# Patient Record
Sex: Male | Born: 1985 | Race: White | Hispanic: No | Marital: Single | Smoking: Current every day smoker
Health system: Southern US, Community
[De-identification: ages and names within clinical notes are randomized; demographics above are authoritative.]

---

## 2009-07-02 ENCOUNTER — Emergency Department: Payer: Self-pay | Admitting: Emergency Medicine

## 2010-12-16 ENCOUNTER — Emergency Department: Payer: Self-pay

## 2011-08-31 ENCOUNTER — Emergency Department: Payer: Self-pay

## 2012-03-04 ENCOUNTER — Emergency Department: Payer: Self-pay | Admitting: Emergency Medicine

## 2012-07-08 ENCOUNTER — Emergency Department: Payer: Self-pay | Admitting: Emergency Medicine

## 2012-11-19 ENCOUNTER — Emergency Department: Payer: Self-pay | Admitting: Emergency Medicine

## 2013-12-08 ENCOUNTER — Emergency Department: Payer: Self-pay | Admitting: Emergency Medicine

## 2013-12-08 LAB — COMPREHENSIVE METABOLIC PANEL
ALT: 37 U/L (ref 12–78)
AST: 32 U/L (ref 15–37)
Albumin: 4.1 g/dL (ref 3.4–5.0)
Alkaline Phosphatase: 63 U/L
Anion Gap: 3 — ABNORMAL LOW (ref 7–16)
BUN: 11 mg/dL (ref 7–18)
Bilirubin,Total: 0.6 mg/dL (ref 0.2–1.0)
CALCIUM: 9.1 mg/dL (ref 8.5–10.1)
CHLORIDE: 106 mmol/L (ref 98–107)
Co2: 29 mmol/L (ref 21–32)
Creatinine: 1.04 mg/dL (ref 0.60–1.30)
EGFR (Non-African Amer.): >60
Glucose: 95 mg/dL (ref 65–99)
OSMOLALITY: 275 (ref 275–301)
Potassium: 4 mmol/L (ref 3.5–5.1)
SODIUM: 138 mmol/L (ref 136–145)
TOTAL PROTEIN: 8.3 g/dL — AB (ref 6.4–8.2)

## 2013-12-08 LAB — CBC
HCT: 43.2 % (ref 40.0–52.0)
HGB: 15.2 g/dL (ref 13.0–18.0)
MCH: 31.9 pg (ref 26.0–34.0)
MCHC: 35.1 g/dL (ref 32.0–36.0)
MCV: 91 fL (ref 80–100)
PLATELETS: 355 10*3/uL (ref 150–440)
RBC: 4.77 10*6/uL (ref 4.40–5.90)
RDW: 12.9 % (ref 11.5–14.5)
WBC: 13.8 10*3/uL — ABNORMAL HIGH (ref 3.8–10.6)

## 2013-12-08 LAB — URINALYSIS, COMPLETE
BACTERIA: NONE SEEN
BLOOD: NEGATIVE
Bilirubin,UR: NEGATIVE
Glucose,UR: NEGATIVE mg/dL (ref 0–75)
KETONE: NEGATIVE
Leukocyte Esterase: NEGATIVE
Nitrite: NEGATIVE
PH: 7 (ref 4.5–8.0)
Protein: NEGATIVE
Specific Gravity: 1.021 (ref 1.003–1.030)
Squamous Epithelial: NONE SEEN

## 2013-12-08 LAB — DRUG SCREEN, URINE
Amphetamines, Ur Screen: NEGATIVE (ref ?–1000)
Barbiturates, Ur Screen: NEGATIVE (ref ?–200)
Benzodiazepine, Ur Scrn: NEGATIVE (ref ?–200)
Cannabinoid 50 Ng, Ur ~~LOC~~: POSITIVE (ref ?–50)
Cocaine Metabolite,Ur ~~LOC~~: POSITIVE (ref ?–300)
MDMA (Ecstasy)Ur Screen: NEGATIVE (ref ?–500)
METHADONE, UR SCREEN: NEGATIVE (ref ?–300)
Opiate, Ur Screen: NEGATIVE (ref ?–300)
Phencyclidine (PCP) Ur S: NEGATIVE (ref ?–25)
Tricyclic, Ur Screen: NEGATIVE (ref ?–1000)

## 2013-12-08 LAB — SALICYLATE LEVEL: Salicylates, Serum: <1.7 mg/dL

## 2013-12-08 LAB — ACETAMINOPHEN LEVEL

## 2013-12-08 LAB — ETHANOL
Ethanol %: <0.003 % (ref 0.000–0.080)
Ethanol: <3 mg/dL

## 2013-12-16 ENCOUNTER — Emergency Department (HOSPITAL_COMMUNITY): Payer: Self-pay

## 2013-12-16 ENCOUNTER — Emergency Department (HOSPITAL_COMMUNITY)
Admission: EM | Admit: 2013-12-16 | Discharge: 2013-12-16 | Discharge status: Against Medical Advice | Payer: Self-pay | Attending: Emergency Medicine | Admitting: Emergency Medicine

## 2013-12-16 ENCOUNTER — Encounter (HOSPITAL_COMMUNITY): Payer: Self-pay | Admitting: Emergency Medicine

## 2013-12-16 DIAGNOSIS — R3911 Hesitancy of micturition: Secondary | ICD-10-CM | POA: Insufficient documentation

## 2013-12-16 DIAGNOSIS — R300 Dysuria: Secondary | ICD-10-CM | POA: Insufficient documentation

## 2013-12-16 DIAGNOSIS — R3915 Urgency of urination: Secondary | ICD-10-CM | POA: Insufficient documentation

## 2013-12-16 DIAGNOSIS — R350 Frequency of micturition: Secondary | ICD-10-CM | POA: Insufficient documentation

## 2013-12-16 DIAGNOSIS — F17200 Nicotine dependence, unspecified, uncomplicated: Secondary | ICD-10-CM | POA: Insufficient documentation

## 2013-12-16 DIAGNOSIS — R109 Unspecified abdominal pain: Secondary | ICD-10-CM | POA: Insufficient documentation

## 2013-12-16 LAB — CBC WITH DIFFERENTIAL/PLATELET
BASOS ABS: 0 10*3/uL (ref 0.0–0.1)
BASOS PCT: 0 % (ref 0–1)
EOS PCT: 1 % (ref 0–5)
Eosinophils Absolute: 0.1 10*3/uL (ref 0.0–0.7)
HEMATOCRIT: 39.8 % (ref 39.0–52.0)
Hemoglobin: 13.8 g/dL (ref 13.0–17.0)
Lymphocytes Relative: 24 % (ref 12–46)
Lymphs Abs: 2.6 10*3/uL (ref 0.7–4.0)
MCH: 32.2 pg (ref 26.0–34.0)
MCHC: 34.7 g/dL (ref 30.0–36.0)
MCV: 93 fL (ref 78.0–100.0)
Monocytes Absolute: 0.9 10*3/uL (ref 0.1–1.0)
Monocytes Relative: 8 % (ref 3–12)
Neutro Abs: 7.3 10*3/uL (ref 1.7–7.7)
Neutrophils Relative %: 67 % (ref 43–77)
Platelets: 319 10*3/uL (ref 150–400)
RBC: 4.28 MIL/uL (ref 4.22–5.81)
RDW: 13.1 % (ref 11.5–15.5)
WBC: 10.9 10*3/uL — ABNORMAL HIGH (ref 4.0–10.5)

## 2013-12-16 LAB — POCT I-STAT, CHEM 8
BUN: 17 mg/dL (ref 6–23)
CALCIUM ION: 1.25 mmol/L — AB (ref 1.12–1.23)
Chloride: 105 mEq/L (ref 96–112)
Creatinine, Ser: 1.1 mg/dL (ref 0.50–1.35)
Glucose, Bld: 105 mg/dL — ABNORMAL HIGH (ref 70–99)
HEMATOCRIT: 43 % (ref 39.0–52.0)
HEMOGLOBIN: 14.6 g/dL (ref 13.0–17.0)
Potassium: 4.1 mEq/L (ref 3.7–5.3)
Sodium: 141 mEq/L (ref 137–147)
TCO2: 25 mmol/L (ref 0–100)

## 2013-12-16 LAB — URINALYSIS, ROUTINE W REFLEX MICROSCOPIC
Bilirubin Urine: NEGATIVE
GLUCOSE, UA: NEGATIVE mg/dL
HGB URINE DIPSTICK: NEGATIVE
KETONES UR: NEGATIVE mg/dL
LEUKOCYTES UA: NEGATIVE
Nitrite: NEGATIVE
Protein, ur: NEGATIVE mg/dL
Specific Gravity, Urine: 1.027 (ref 1.005–1.030)
Urobilinogen, UA: 0.2 mg/dL (ref 0.0–1.0)
pH: 7.5 (ref 5.0–8.0)

## 2013-12-16 LAB — URINE MICROSCOPIC-ADD ON

## 2013-12-16 MED ORDER — IOHEXOL 300 MG/ML  SOLN
80.0000 mL | Freq: Once | INTRAMUSCULAR | Status: AC | PRN
  Administered 2013-12-16: 90 mL via INTRAVENOUS

## 2013-12-16 MED ORDER — MORPHINE SULFATE 4 MG/ML IJ SOLN
4.0000 mg | Freq: Once | INTRAMUSCULAR | Status: AC
  Administered 2013-12-16: 4 mg via INTRAVENOUS
  Filled 2013-12-16: qty 1

## 2013-12-16 MED ORDER — SODIUM CHLORIDE 0.9 % IV BOLUS (SEPSIS)
1000.0000 mL | Freq: Once | INTRAVENOUS | Status: AC
  Administered 2013-12-16: 1000 mL via INTRAVENOUS

## 2013-12-16 MED ORDER — IOHEXOL 300 MG/ML  SOLN
25.0000 mL | Freq: Once | INTRAMUSCULAR | Status: AC | PRN
  Administered 2013-12-16: 25 mL via ORAL

## 2013-12-16 MED ORDER — ONDANSETRON HCL 4 MG/2ML IJ SOLN
4.0000 mg | Freq: Once | INTRAMUSCULAR | Status: AC
  Administered 2013-12-16: 4 mg via INTRAVENOUS
  Filled 2013-12-16: qty 2

## 2013-12-16 MED ORDER — KETOROLAC TROMETHAMINE 30 MG/ML IJ SOLN
30.0000 mg | Freq: Once | INTRAMUSCULAR | Status: DC
  Filled 2013-12-16: qty 1

## 2013-12-16 NOTE — ED Notes (Signed)
Hurts to void since last night sharp pain denies   D/c from penis  C/o back pain left side

## 2013-12-16 NOTE — ED Provider Notes (Signed)
Medical screening examination/treatment/procedure(s) were performed by non-physician practitioner and as supervising physician I was immediately available for consultation/collaboration.  EKG Interpretation   None         Gwyneth SproutWhitney Ryder Chesmore, MD 12/16/13 1559

## 2013-12-16 NOTE — Discharge Instructions (Signed)

## 2013-12-16 NOTE — ED Provider Notes (Signed)
CSN: [id number]     Arrival date & time 12/16/13  1040 History   First MD Initiated Contact with Patient 12/16/13 1109     Chief Complaint  Patient presents with  . Dysuria   (Consider location/radiation/quality/duration/timing/severity/associated sxs/prior Treatment) HPI  Patient presents to the ER with complaints of L flank pain for one week and dysuria starting today. He says that he has been having back pain and mild cramping pain that felt as though he had pulled a muscle, then this morning he had urgency, dysuria and hesitancy. He did not notice any hematuria. He denies any penile discharge, as noted in nurse note. He denies any penile pain or testicular, pain, swelling or rashes. He has not had fever, nausea, vomiting or diarrhea. No history of kidney stones. Bowel movements have been regular.  History reviewed. No pertinent past medical history. History reviewed. No pertinent past surgical history. No family history on file. History  Substance Use Topics  . Smoking status: Current Every Day Smoker  . Smokeless tobacco: Not on file  . Alcohol Use: Yes    Review of Systems The patient denies anorexia, fever, weight loss,, vision loss, decreased hearing, hoarseness, chest pain, syncope, dyspnea on exertion, peripheral edema, balance deficits, hemoptysis,  melena, hematochezia, severe indigestion/heartburn, hematuria, incontinence, genital sores, muscle weakness, suspicious skin lesions, transient blindness, difficulty walking, depression, unusual weight change, abnormal bleeding, enlarged lymph nodes, angioedema, and breast masses.  Allergies  Review of patient's allergies indicates no known allergies.  Home Medications   Current Outpatient Rx  Name  Route  Sig  Dispense  Refill  . acetaminophen (TYLENOL) 500 MG tablet   Oral   Take 500-1,000 mg by mouth every 6 (six) hours as needed for fever or headache (pain).         Marland Kitchen. ibuprofen (ADVIL,MOTRIN) 200 MG tablet   Oral  Take 200-400 mg by mouth every 6 (six) hours as needed for fever or headache (pain).          BP 133/69  Pulse 84  Temp(Src) 97.6 F (36.4 C) (Oral)  Resp 18  SpO2 97% Physical Exam  Nursing note and vitals reviewed. Constitutional: He appears well-developed and well-nourished. No distress.  HENT:  Head: Normocephalic and atraumatic.  Eyes: Pupils are equal, round, and reactive to light.  Neck: Normal range of motion. Neck supple.  Cardiovascular: Normal rate and regular rhythm.   Pulmonary/Chest: Effort normal.  Abdominal: Soft. Bowel sounds are normal. There is tenderness in the suprapubic area, left upper quadrant and left lower quadrant. There is CVA tenderness. There is no rigidity, no rebound, no guarding, no tenderness at McBurney's point and negative Murphy's sign.    Abdominal exam limited by large omentum    Neurological: He is alert.  Skin: Skin is warm and dry.    ED Course  Procedures (including critical care time) Labs Review Labs Reviewed  URINALYSIS, ROUTINE W REFLEX MICROSCOPIC - Abnormal; Notable for the following:    APPearance TURBID (*)    All other components within normal limits  CBC WITH DIFFERENTIAL - Abnormal; Notable for the following:    WBC 10.9 (*)    All other components within normal limits  POCT I-STAT, CHEM 8 - Abnormal; Notable for the following:    Glucose, Bld 105 (*)    Calcium, Ion 1.25 (*)    All other components within normal limits  GC/CHLAMYDIA PROBE AMP  URINE MICROSCOPIC-ADD ON   Imaging Review Ct Abdomen Pelvis W Contrast  12/16/2013   CLINICAL DATA:  Left lower quadrant pain, left flank pain.  EXAM: CT ABDOMEN AND PELVIS WITH CONTRAST  TECHNIQUE: Multidetector CT imaging of the abdomen and pelvis was performed using the standard protocol following bolus administration of intravenous contrast.  CONTRAST:  90mL OMNIPAQUE IOHEXOL 300 MG/ML  SOLN  COMPARISON:  None.  FINDINGS: Lung bases are clear. No effusions. Heart is normal  size.  Liver, gallbladder, spleen, pancreas, adrenals and kidneys are unremarkable. No renal or ureteral stones. No hydronephrosis. Urinary bladder is unremarkable.  Appendix is visualized and is normal. Stomach, large and small bowel are unremarkable. No free fluid, free air or adenopathy. No evidence of diverticulosis or diverticulitis.  No acute bony abnormality.  IMPRESSION: No acute findings in the abdomen or pelvis.   Electronically Signed   By: Charlett Nose M.D.   On: 12/16/2013 13:45    EKG Interpretation   None       MDM   1. Dysuria    Patients Urinalysis and abdominal CT have come back without any significant findings. He received fluids and pain medication in the ED but is feeling somewhat better but is still holding bellies.  The patient says he has not had sex for 7 months and that it was protected but due to his urinary symptoms I feel that he needs to be tested for [facility]. He refuses to be swabbed. Because of this I am unable to complete by work-up. I have discussed with the patient that he will need to sign out against medical advise meaning that he may 100% responsible for the hospital bill and is at risk of deterioration, inability to bear children or possibly death. He had decided to signs out AMA.     Dorthula Matas, PA-C 12/16/13 1406

## 2014-12-03 IMAGING — CT CT ABD-PELV W/ CM
2 of 4 series · 16 of 46 positions shown, 18 images · IV contrast (APPLIED)
Comparison: None.

CLINICAL DATA: Left lower quadrant pain, left flank pain.

EXAM:
CT ABDOMEN AND PELVIS WITH CONTRAST
TECHNIQUE: Multidetector CT imaging of the abdomen and pelvis was performed
using the standard protocol following bolus administration of
intravenous contrast.
CONTRAST:  90mL OMNIPAQUE IOHEXOL 300 MG/ML  SOLN

[Series 2: abd/ pelvis 5.0 b30f · axial · 0.98mm/px · z∈[+862,+1342]mm · 13 of 106 slices shown, 15 images]
[im 5/106  soft-tissue]
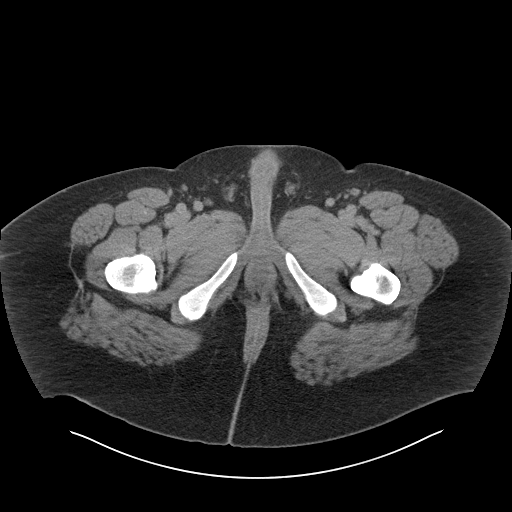
[im 5/106  bone]
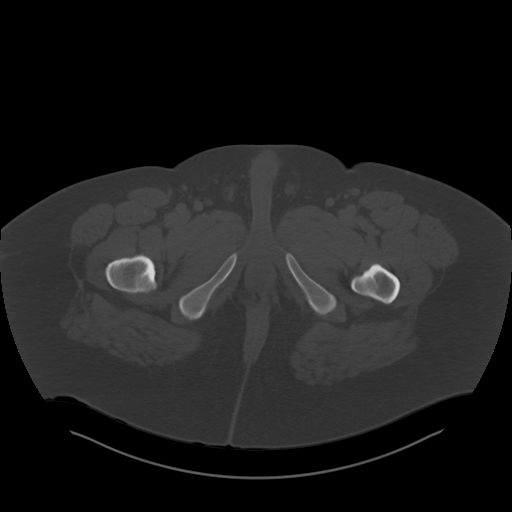
[im 14/106  soft-tissue]
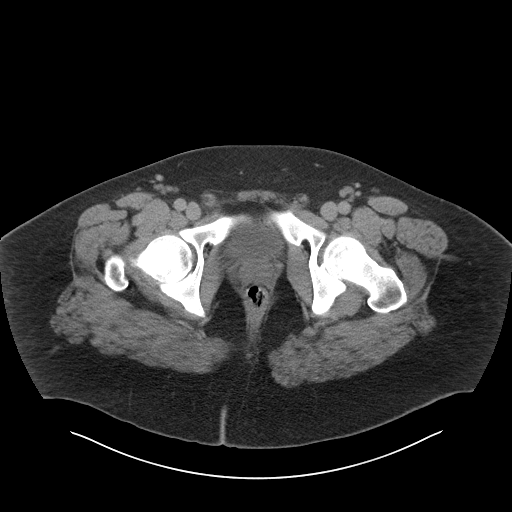
[im 23/106  soft-tissue]
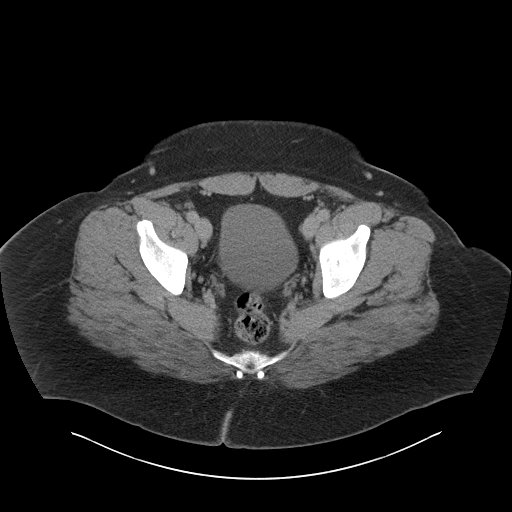
[im 28/106  soft-tissue]
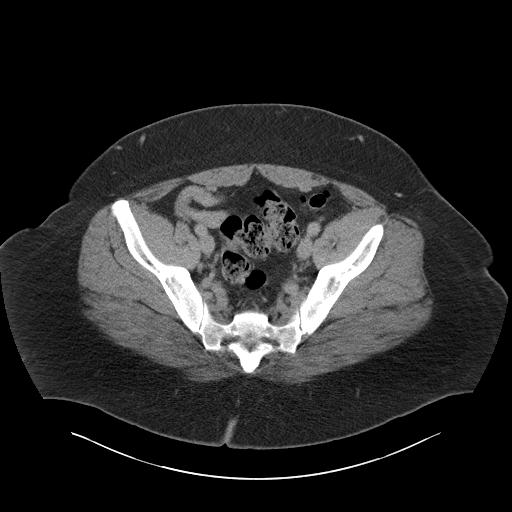
[im 37/106  soft-tissue]
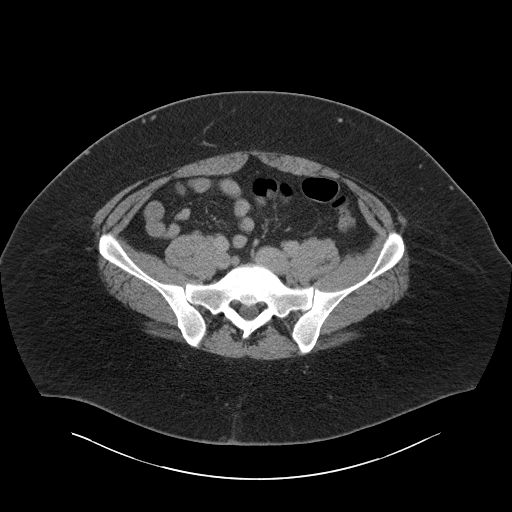
[im 46/106  soft-tissue]
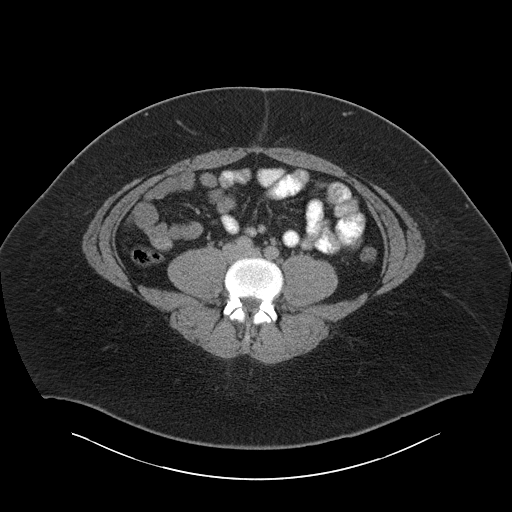
[im 55/106  soft-tissue]
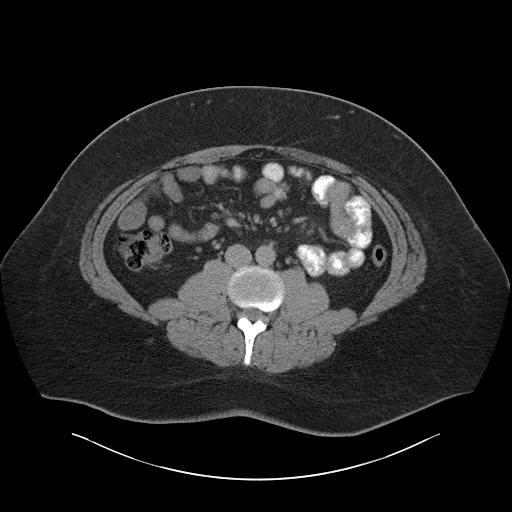
[im 60/106  soft-tissue]
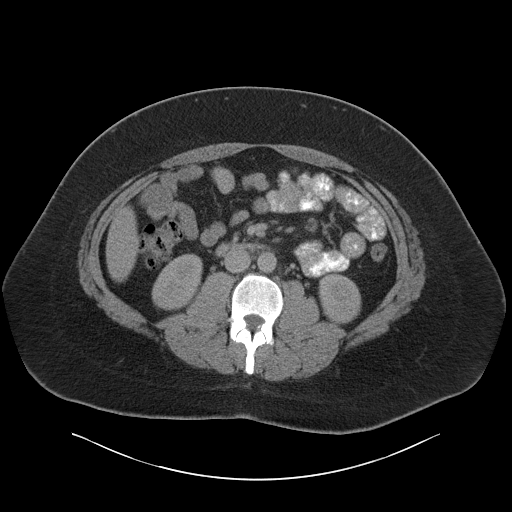
[im 69/106  soft-tissue]
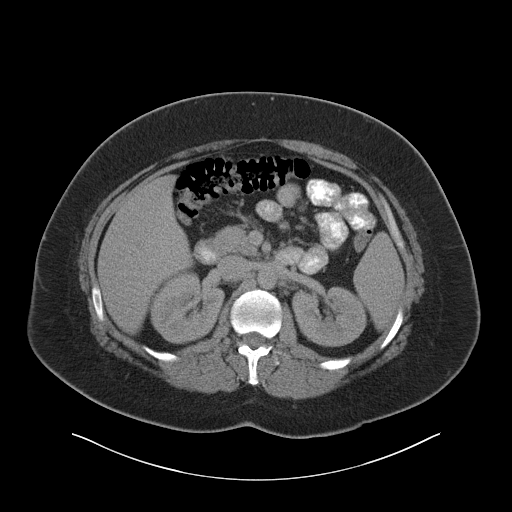
[im 69/106  bone]
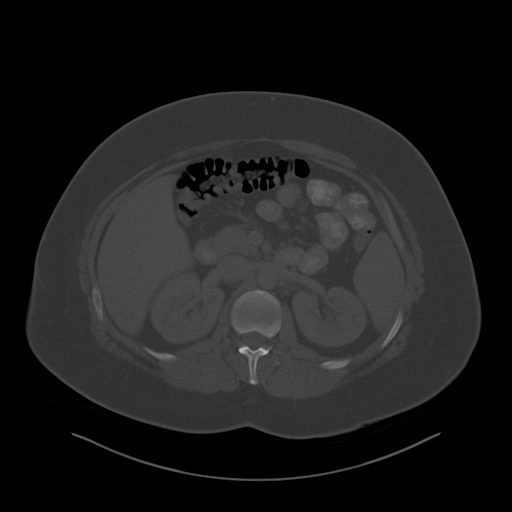
[im 78/106  soft-tissue]
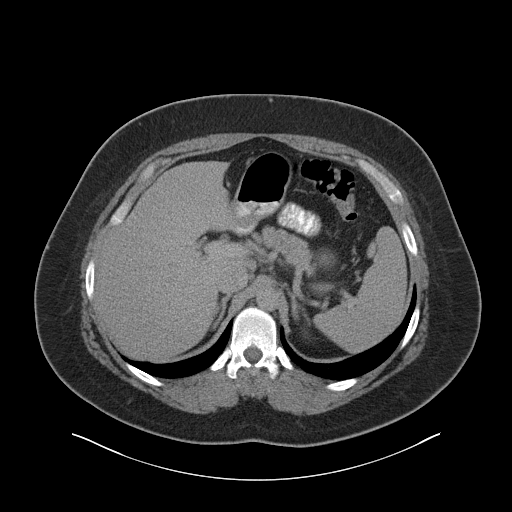
[im 83/106  soft-tissue]
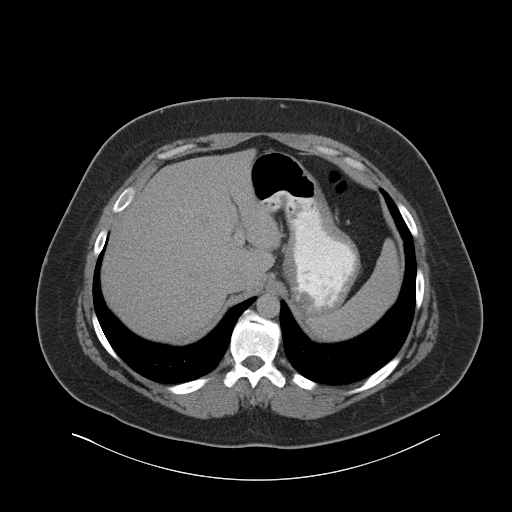
[im 92/106  soft-tissue]
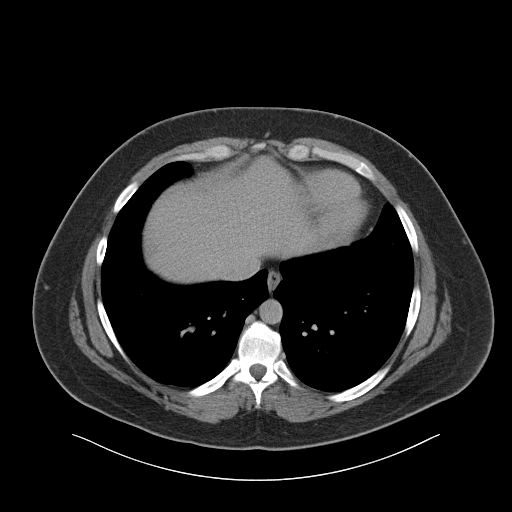
[im 101/106  soft-tissue]
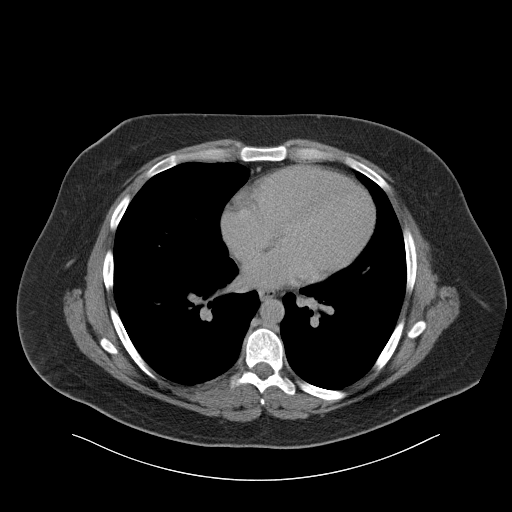

[Series 5: cor · coronal · 0.93mm/px · 3 of 167 slices shown]
[im 56/167  soft-tissue]
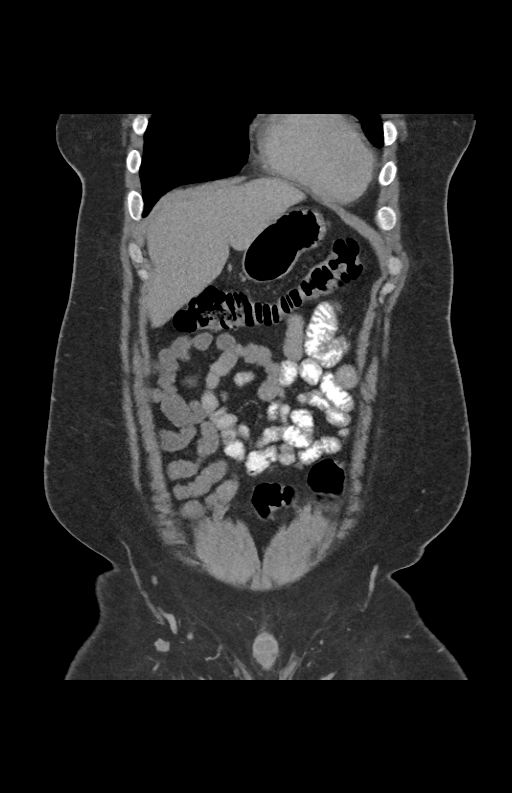
[im 74/167  soft-tissue]
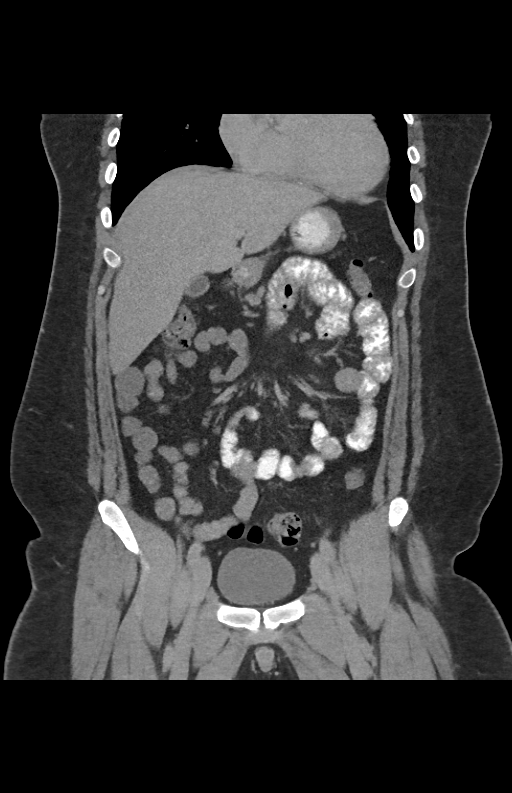
[im 93/167  soft-tissue]
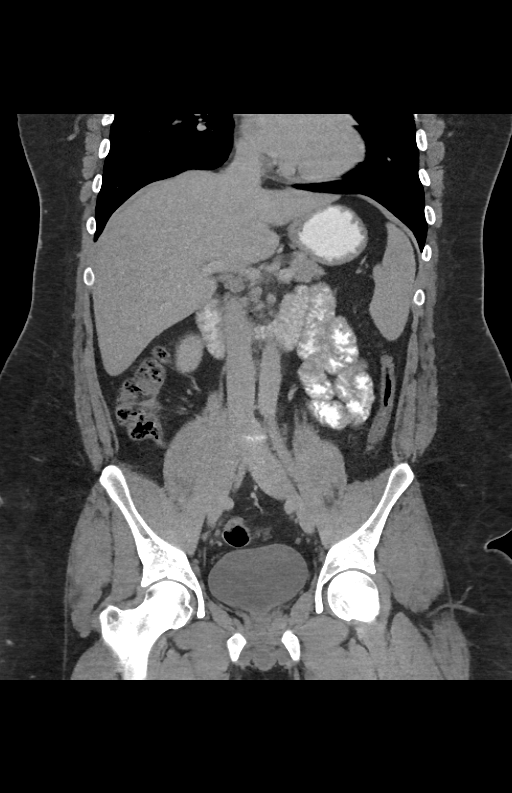

[16 of 46 positions shown; findings below may reference images not displayed]

FINDINGS: Lung bases are clear. No effusions. Heart is normal size.

Liver, gallbladder, spleen, pancreas, adrenals and kidneys are
unremarkable. No renal or ureteral stones. No hydronephrosis.
Urinary bladder is unremarkable.

Appendix is visualized and is normal. Stomach, large and small bowel
are unremarkable. No free fluid, free air or adenopathy. No evidence
of diverticulosis or diverticulitis.

No acute bony abnormality.
IMPRESSION: No acute findings in the abdomen or pelvis.
